# Patient Record
Sex: Male | Born: 1985 | Race: Asian | Hispanic: No | Marital: Married | Smoking: Never smoker
Health system: Southern US, Community
[De-identification: ages and names within clinical notes are randomized; demographics above are authoritative.]

## PROBLEM LIST (undated history)

## (undated) DIAGNOSIS — K759 Inflammatory liver disease, unspecified: Secondary | ICD-10-CM

---

## 2017-10-27 DIAGNOSIS — Z0000 Encounter for general adult medical examination without abnormal findings: Secondary | ICD-10-CM | POA: Diagnosis not present

## 2017-10-27 DIAGNOSIS — Z8619 Personal history of other infectious and parasitic diseases: Secondary | ICD-10-CM | POA: Diagnosis not present

## 2017-10-27 DIAGNOSIS — Z23 Encounter for immunization: Secondary | ICD-10-CM | POA: Diagnosis not present

## 2017-11-18 DIAGNOSIS — Z8619 Personal history of other infectious and parasitic diseases: Secondary | ICD-10-CM | POA: Diagnosis not present

## 2018-03-23 ENCOUNTER — Other Ambulatory Visit: Payer: Self-pay | Admitting: Gastroenterology

## 2018-03-23 DIAGNOSIS — B1910 Unspecified viral hepatitis B without hepatic coma: Secondary | ICD-10-CM

## 2018-03-23 DIAGNOSIS — B169 Acute hepatitis B without delta-agent and without hepatic coma: Secondary | ICD-10-CM | POA: Diagnosis not present

## 2018-03-30 ENCOUNTER — Ambulatory Visit
Admission: RE | Admit: 2018-03-30 | Discharge: 2018-03-30 | Disposition: A | Discharge status: Home / Self Care | Payer: BLUE CROSS/BLUE SHIELD | Source: Ambulatory Visit | Attending: Gastroenterology | Admitting: Gastroenterology

## 2018-03-30 DIAGNOSIS — B1910 Unspecified viral hepatitis B without hepatic coma: Secondary | ICD-10-CM

## 2018-03-30 DIAGNOSIS — K7689 Other specified diseases of liver: Secondary | ICD-10-CM | POA: Diagnosis not present

## 2018-04-02 ENCOUNTER — Other Ambulatory Visit (HOSPITAL_COMMUNITY): Payer: Self-pay | Admitting: Gastroenterology

## 2018-04-02 DIAGNOSIS — B1910 Unspecified viral hepatitis B without hepatic coma: Secondary | ICD-10-CM

## 2018-04-10 ENCOUNTER — Other Ambulatory Visit: Payer: Self-pay | Admitting: Student

## 2018-04-13 ENCOUNTER — Ambulatory Visit (HOSPITAL_COMMUNITY)
Admission: RE | Admit: 2018-04-13 | Discharge: 2018-04-13 | Disposition: A | Discharge status: Home / Self Care | Payer: BLUE CROSS/BLUE SHIELD | Source: Ambulatory Visit | Attending: Gastroenterology | Admitting: Gastroenterology

## 2018-04-13 ENCOUNTER — Encounter (HOSPITAL_COMMUNITY): Payer: Self-pay

## 2018-04-13 DIAGNOSIS — B1910 Unspecified viral hepatitis B without hepatic coma: Secondary | ICD-10-CM | POA: Diagnosis not present

## 2018-04-13 DIAGNOSIS — K739 Chronic hepatitis, unspecified: Secondary | ICD-10-CM | POA: Diagnosis not present

## 2018-04-13 HISTORY — DX: Inflammatory liver disease, unspecified: K75.9

## 2018-04-13 LAB — CBC
HEMATOCRIT: 51.2 % (ref 39.0–52.0)
HEMOGLOBIN: 15.8 g/dL (ref 13.0–17.0)
MCH: 25.5 pg — ABNORMAL LOW (ref 26.0–34.0)
MCHC: 30.9 g/dL (ref 30.0–36.0)
MCV: 82.7 fL (ref 80.0–100.0)
Platelets: 271 10*3/uL (ref 150–400)
RBC: 6.19 MIL/uL — ABNORMAL HIGH (ref 4.22–5.81)
RDW: 12.1 % (ref 11.5–15.5)
WBC: 7.1 10*3/uL (ref 4.0–10.5)
nRBC: 0 % (ref 0.0–0.2)

## 2018-04-13 LAB — PROTIME-INR
INR: 1.08
PROTHROMBIN TIME: 13.9 s (ref 11.4–15.2)

## 2018-04-13 LAB — APTT: aPTT: 33 seconds (ref 24–36)

## 2018-04-13 MED ORDER — SODIUM CHLORIDE 0.9 % IV SOLN: INTRAVENOUS | Status: DC

## 2018-04-13 MED ORDER — GELATIN ABSORBABLE 12-7 MM EX MISC
CUTANEOUS | Status: AC
  Filled 2018-04-13: qty 1

## 2018-04-13 MED ORDER — SODIUM CHLORIDE 0.9 % IV SOLN
INTRAVENOUS | Status: AC | PRN
  Administered 2018-04-13: 10 mL/h via INTRAVENOUS

## 2018-04-13 MED ORDER — MIDAZOLAM HCL 2 MG/2ML IJ SOLN
INTRAMUSCULAR | Status: AC
  Filled 2018-04-13: qty 2

## 2018-04-13 MED ORDER — FENTANYL CITRATE (PF) 100 MCG/2ML IJ SOLN
INTRAMUSCULAR | Status: AC | PRN
  Administered 2018-04-13: 50 ug via INTRAVENOUS

## 2018-04-13 MED ORDER — MIDAZOLAM HCL 2 MG/2ML IJ SOLN
INTRAMUSCULAR | Status: AC | PRN
  Administered 2018-04-13: 1 mg via INTRAVENOUS

## 2018-04-13 MED ORDER — OXYCODONE HCL 5 MG PO TABS: 5.0000 mg | ORAL_TABLET | ORAL | Status: DC | PRN

## 2018-04-13 MED ORDER — FENTANYL CITRATE (PF) 100 MCG/2ML IJ SOLN
INTRAMUSCULAR | Status: AC
  Filled 2018-04-13: qty 2

## 2018-04-13 MED ORDER — LIDOCAINE HCL (PF) 1 % IJ SOLN
INTRAMUSCULAR | Status: AC
  Filled 2018-04-13: qty 30

## 2018-04-13 NOTE — Procedures (Signed)
US guided core biopsies of right hepatic lobe.  3 cores obtained.  No blood loss and no immediate complication.

## 2018-04-13 NOTE — Discharge Instructions (Addendum)
Liver Biopsy, Care After °These instructions give you information on caring for yourself after your procedure. Your doctor may also give you more specific instructions. Call your doctor if you have any problems or questions after your procedure. °Follow these instructions at home: °· Rest at home for 1-2 days or as told by your doctor. °· Have someone stay with you for at least 24 hours. °· Do not do these things in the first 24 hours: °? Drive. °? Use machinery. °? Take care of other people. °? Sign legal documents. °? Take a bath or shower. °· There are many different ways to close and cover a cut (incision). For example, a cut can be closed with stitches, skin glue, or adhesive strips. Follow your doctor's instructions on: °? Taking care of your cut. °? Changing and removing your bandage (dressing). °? Removing whatever was used to close your cut. °· Do not drink alcohol in the first week. °· Do not lift more than 5 pounds or play contact sports for the first 2 weeks. °· Take medicines only as told by your doctor. For 1 week, do not take medicine that has aspirin in it or medicines like ibuprofen. °· Get your test results. °Contact a doctor if: °· A cut bleeds and leaves more than just a small spot of blood. °· A cut is red, puffs up (swells), or hurts more than before. °· Fluid or something else comes from a cut. °· A cut smells bad. °· You have a fever or chills. °Get help right away if: °· You have swelling, bloating, or pain in your belly (abdomen). °· You get dizzy or faint. °· You have a rash. °· You feel sick to your stomach (nauseous) or throw up (vomit). °· You have trouble breathing, feel short of breath, or feel faint. °· Your chest hurts. °· You have problems talking or seeing. °· You have trouble balancing or moving your arms or legs. °This information is not intended to replace advice given to you by your health care provider. Make sure you discuss any questions you have with your health care  provider. °Document Released: 03/19/2008 Document Revised: 11/16/2015 Document Reviewed: 08/06/2013 °Elsevier Interactive Patient Education © 2018 [employer] Inc[employer] ° ° ° ° °Moderate Conscious Sedation, Adult, Care After °These instructions provide you with information about caring for yourself after your procedure. Your health care provider may also give you more specific instructions. Your treatment has been planned according to current medical practices, but problems sometimes occur. Call your health care provider if you have any problems or questions after your procedure. °What can I expect after the procedure? °After your procedure, it is common: °· To feel sleepy for several hours. °· To feel clumsy and have poor balance for several hours. °· To have poor judgment for several hours. °· To vomit if you eat too soon. ° °Follow these instructions at home: °For at least 24 hours after the procedure: ° °· Do not: °? Participate in activities where you could fall or become injured. °? Drive. °? Use heavy machinery. °? Drink alcohol. °? Take sleeping pills or medicines that cause drowsiness. °? Make important decisions or sign legal documents. °? Take care of children on your own. °· Rest. °Eating and drinking °· Follow the diet recommended by your health care provider. °· If you vomit: °? Drink water, juice, or soup when you can drink without vomiting. °? Make sure you have little or no nausea before eating solid foods. °General instructions °·   Have a responsible adult stay with you until you are awake and alert. °· Take over-the-counter and prescription medicines only as told by your health care provider. °· If you smoke, do not smoke without supervision. °· Keep all follow-up visits as told by your health care provider. This is important. °Contact a health care provider if: °· You keep feeling nauseous or you keep vomiting. °· You feel light-headed. °· You develop a rash. °· You have a fever. °Get help right away  if: °· You have trouble breathing. °This information is not intended to replace advice given to you by your health care provider. Make sure you discuss any questions you have with your health care provider. °Document Released: 03/31/2013 Document Revised: 11/13/2015 Document Reviewed: 09/30/2015 °Elsevier Interactive Patient Education © 2018 Elsevier Inc[employer] ° ° °

## 2018-04-13 NOTE — H&P (Signed)
Chief Complaint: Patient was seen in consultation today for random liver biopsy at the request of Kerin Salen  Referring Physician(s): Kerin Salen  Supervising Physician: Richarda Overlie  Patient Status: [facility] - Out-pt  History of Present Illness: Gregory Browning is a 32 y.o. male   Hep B: 10 yr hx Dr Marca Ancona requesting random liver bx  [place] 03/30/18: IMPRESSION: Increased hepatic echotexture likely reflecting fatty infiltrative change. Well-circumscribed 9 mm diameter right lobe hyperechoic focus likely reflects a hemangioma. Six-month follow-up ultrasound is recommended.     Past Medical History:  Diagnosis Date  . Hepatitis     History reviewed. No pertinent surgical history.  Allergies: Patient has no known allergies.  Medications: Prior to Admission medications   Not on File     Family History  Problem Relation Age of Onset  . Liver disease Mother   . Diabetes Mother   . Liver disease Father   . Liver disease Brother     Social History   Socioeconomic History  . Marital status: Married    Spouse name: Not on file  . Number of children: Not on file  . Years of education: Not on file  . Highest education level: Not on file  Occupational History  . Not on file  Social Needs  . Financial resource strain: Not on file  . Food insecurity:    Worry: Not on file    Inability: Not on file  . Transportation needs:    Medical: Not on file    Non-medical: Not on file  Tobacco Use  . Smoking status: Never Smoker  . Smokeless tobacco: Never Used  Substance and Sexual Activity  . Alcohol use: Yes    Comment: occassional  . Drug use: Never  . Sexual activity: Not on file  Lifestyle  . Physical activity:    Days per week: Not on file    Minutes per session: Not on file  . Stress: Not on file  Relationships  . Social connections:    Talks on phone: Not on file    Gets together: Not on file    Attends religious service: Not on file    Active member of club or  organization: Not on file    Attends meetings of clubs or organizations: Not on file    Relationship status: Not on file  Other Topics Concern  . Not on file  Social History Narrative  . Not on file    Review of Systems: A 12 point ROS discussed and pertinent positives are indicated in the HPI above.  All other systems are negative.  Review of Systems  Constitutional: Negative for activity change, fatigue and fever.  Respiratory: Negative for cough and shortness of breath.   Cardiovascular: Negative for chest pain.  Gastrointestinal: Negative for abdominal pain.  Musculoskeletal: Negative for back pain.  Neurological: Negative for weakness.  Psychiatric/Behavioral: Negative for behavioral problems and confusion.    Vital Signs: BP 104/78   Pulse 81   Temp 97.9 F (36.6 C)   Resp 16   Ht 5\' 5"  (1.651 m)   Wt 110 lb (49.9 kg)   SpO2 100%   BMI 18.30 kg/m   Physical Exam  Constitutional: He is oriented to person, place, and time. He appears well-nourished.  HENT:  Head: Atraumatic.  Eyes: Conjunctivae and EOM are normal.  Cardiovascular: Normal rate, regular rhythm and normal heart sounds.  Pulmonary/Chest: Effort normal and breath sounds normal.  Abdominal: Soft. Bowel sounds are normal.  Musculoskeletal:  Normal range of motion.  Neurological: He is alert and oriented to person, place, and time.  Skin: Skin is warm and dry.  Psychiatric: He has a normal mood and affect. His behavior is normal. Judgment and thought content normal.  Vitals reviewed.   Imaging: US Abdomen Limited Ruq  Result Date: 03/30/2018 CLINICAL DATA:  Ten year history of hepatitis B EXAM: ULTRASOUND ABDOMEN LIMITED RIGHT UPPER QUADRANT COMPARISON:  None. FINDINGS: Gallbladder: No gallstones or wall thickening visualized. No sonographic Murphy sign noted by sonographer. Common bile duct: Diameter: 1.7 mm Liver: The hepatic echotexture is mildly increased diffusely. In the right hepatic lobe there is  a subtle area of increased echogenicity that is smoothly marginated and measures 9 mm in diameter. There is some internal vascularity. There is no intrahepatic ductal dilation. The surface contour of the liver is smooth. Portal vein is patent on color Doppler imaging with normal direction of blood flow towards the liver. IMPRESSION: Increased hepatic echotexture likely reflecting fatty infiltrative change. Well-circumscribed 9 mm diameter right lobe hyperechoic focus likely reflects a hemangioma. Six-month follow-up ultrasound is recommended. Normal appearance of the gallbladder and common bile duct. Electronically Signed   By: David  [place] M.D.   On: 03/30/2018 10:53    Labs:  CBC: No results for input(s): WBC, HGB, HCT, PLT in the last 8760 hours.  COAGS: Recent Labs    04/13/18 0655  INR 1.08  APTT 33    BMP: No results for input(s): NA, K, CL, CO2, GLUCOSE, BUN, CALCIUM, CREATININE, GFRNONAA, GFRAA in the last 8760 hours.  Invalid input(s): CMP  LIVER FUNCTION TESTS: No results for input(s): BILITOT, AST, ALT, ALKPHOS, PROT, ALBUMIN in the last 8760 hours.  TUMOR MARKERS: No results for input(s): AFPTM, CEA, CA199, CHROMGRNA in the last 8760 hours.  Assessment and Plan:  Hep B 10 yrs US showing hemangioma and fatty infiltrative changes Now scheduled for random liver biopsy Risks and benefits discussed with the patient including, but not limited to bleeding, infection, damage to adjacent structures or low yield requiring additional tests.  All of the patient's questions were answered, patient is agreeable to proceed. Consent signed and in chart.   Thank you for this interesting consult.  I greatly enjoyed meeting Gregory Browning and look forward to participating in their care.  A copy of this report was sent to the requesting provider on this date.  Electronically Signed: Robet Leu, PA-C 04/13/2018, 7:59 AM   I spent a total of  30 Minutes   in face to face in clinical  consultation, greater than 50% of which was counseling/coordinating care for random liver bx

## 2018-04-20 DIAGNOSIS — B181 Chronic viral hepatitis B without delta-agent: Secondary | ICD-10-CM | POA: Diagnosis not present

## 2018-05-25 DIAGNOSIS — B181 Chronic viral hepatitis B without delta-agent: Secondary | ICD-10-CM | POA: Diagnosis not present

## 2018-06-22 DIAGNOSIS — B181 Chronic viral hepatitis B without delta-agent: Secondary | ICD-10-CM | POA: Diagnosis not present

## 2018-09-22 ENCOUNTER — Other Ambulatory Visit: Payer: Self-pay | Admitting: Gastroenterology

## 2018-09-22 DIAGNOSIS — B1910 Unspecified viral hepatitis B without hepatic coma: Secondary | ICD-10-CM

## 2018-09-25 ENCOUNTER — Other Ambulatory Visit: Payer: BLUE CROSS/BLUE SHIELD

## 2018-10-26 ENCOUNTER — Inpatient Hospital Stay: Admission: RE | Admit: 2018-10-26 | Payer: BLUE CROSS/BLUE SHIELD | Source: Ambulatory Visit

## 2019-02-03 IMAGING — US US BIOPSY CORE LIVER
1 series · 10 of 10 positions shown · non-contrast
Comparison: none

INDICATION: 32-year-old with hepatitis-B.  Request for random liver biopsy.

[Series 1: us biopsy core liver · 0.14mm/px · 10 of 10 slices shown]
[im 1/10]
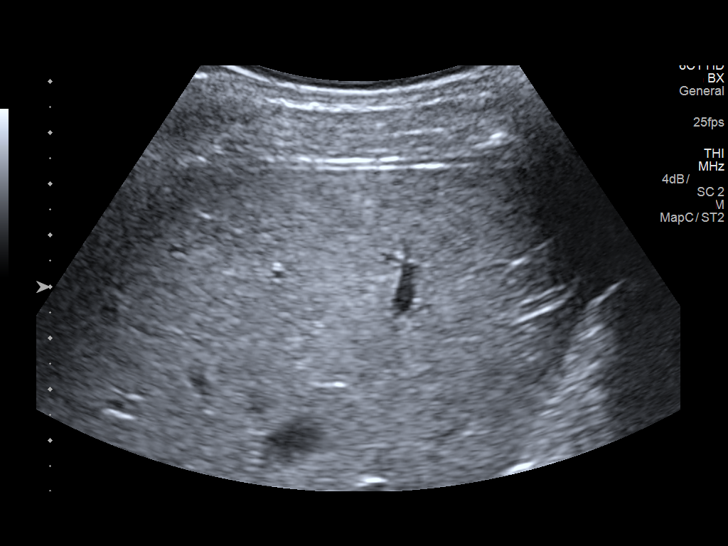
[im 2/10]
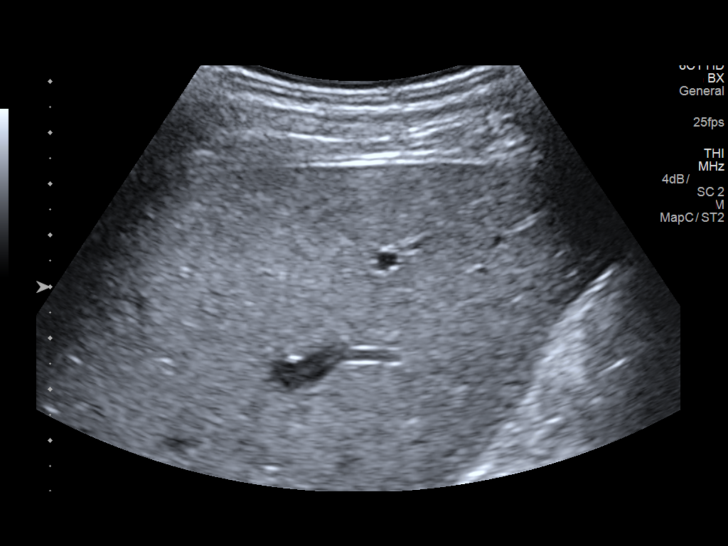
[im 3/10]
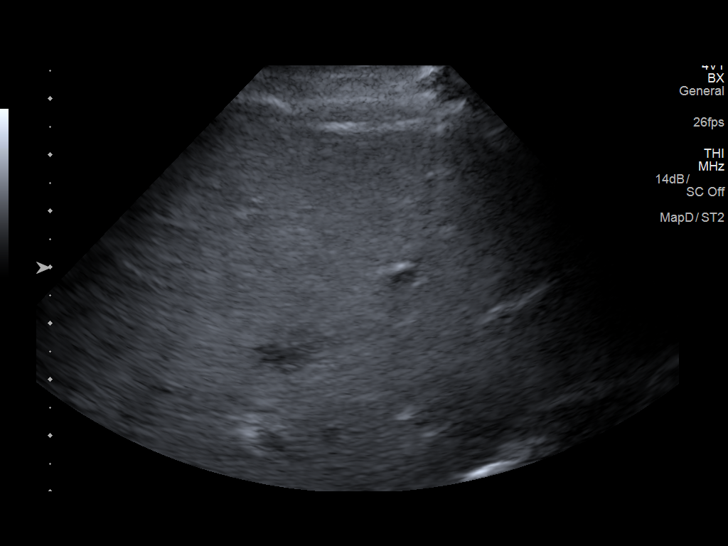
[im 4/10]
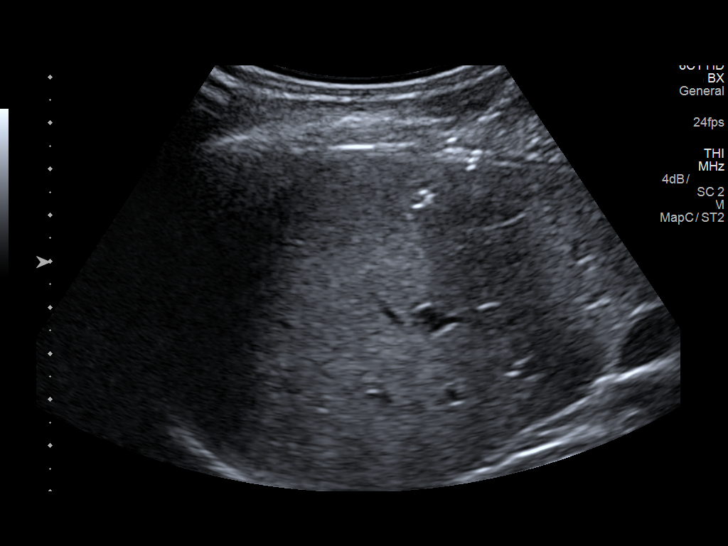
[im 5/10]
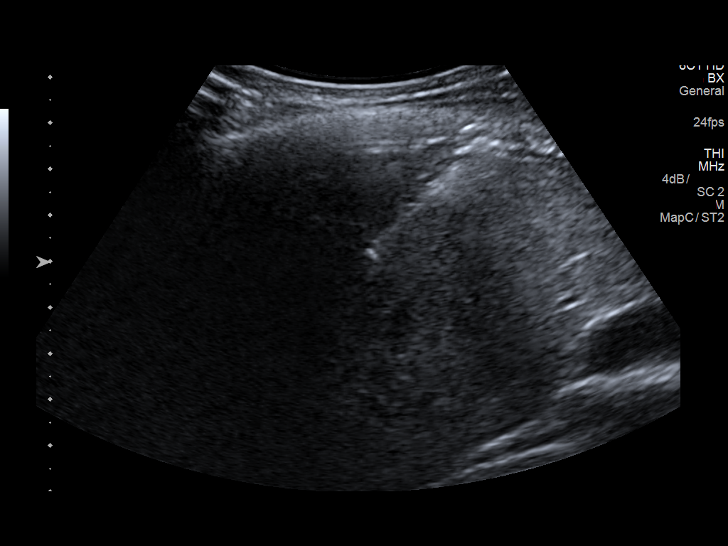
[im 6/10]
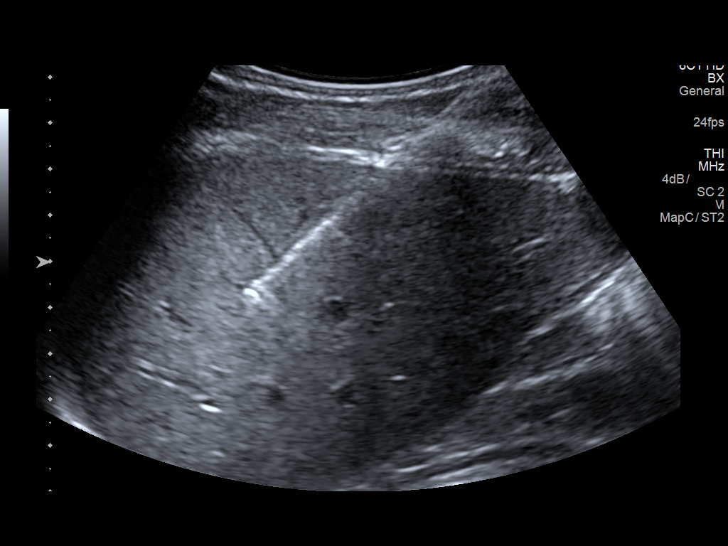
[im 7/10]
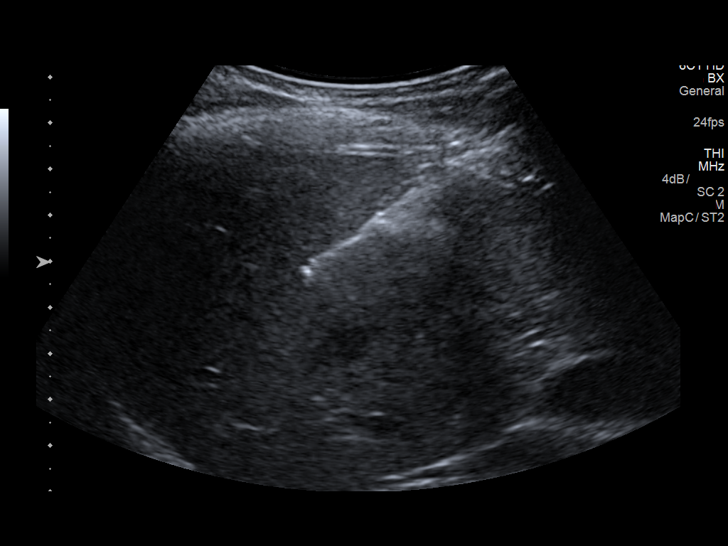
[im 8/10]
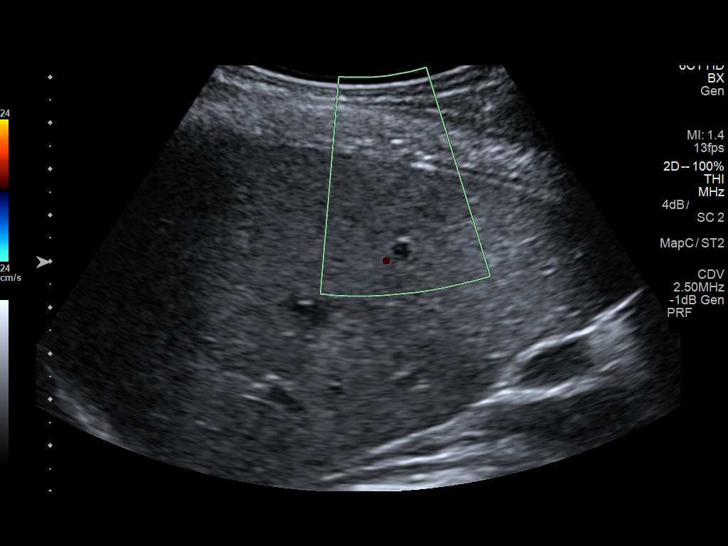
[im 9/10]
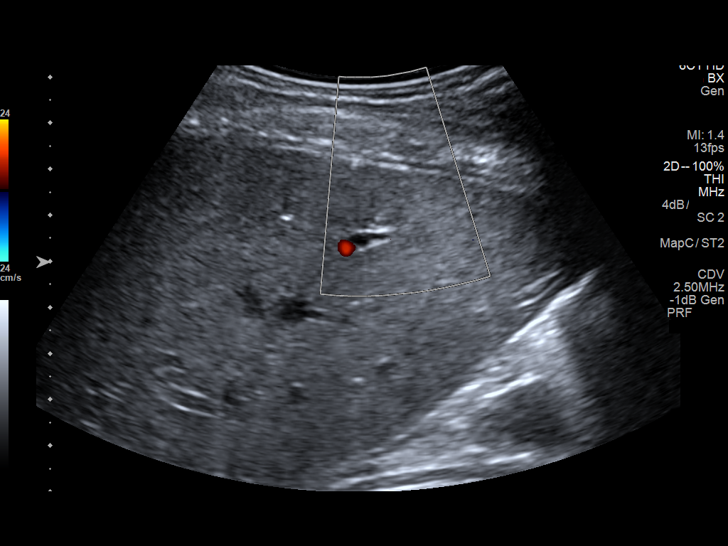
[im 10/10]
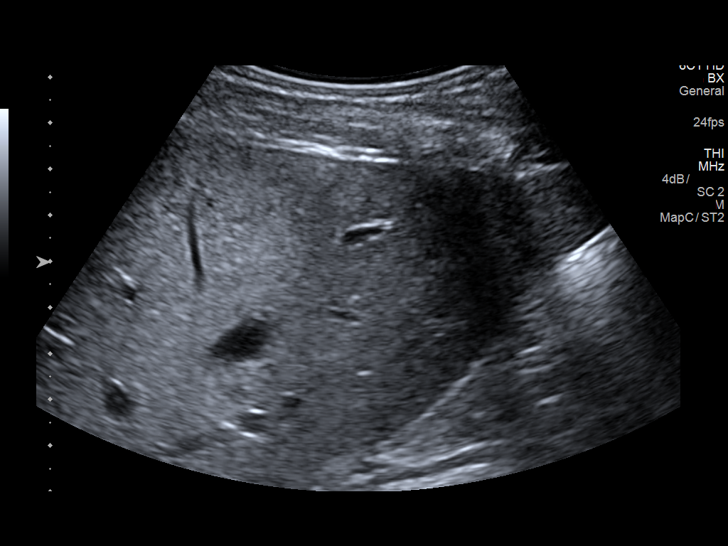

[10 of 10 positions shown; findings below may reference images not displayed]

EXAM:
ULTRASOUND-GUIDED RANDOM LIVER BIOPSY

MEDICATIONS:
None.

ANESTHESIA/SEDATION:
Moderate (conscious) sedation was employed during this procedure. A
total of Versed 1.0 mg and Fentanyl 50 mcg was administered
intravenously.

Moderate Sedation Time: 16 minutes. The patient's level of
consciousness and vital signs were monitored continuously by
radiology nursing throughout the procedure under my direct
supervision.

FLUOROSCOPY TIME:  None

COMPLICATIONS:
None immediate.

PROCEDURE:
Informed written consent was obtained from the patient after a
thorough discussion of the procedural risks, benefits and
alternatives. All questions were addressed and a translator was used
for this informed consent. Maximal Sterile Barrier Technique was
utilized including caps, mask, sterile gowns, sterile gloves,
sterile drape, hand hygiene and skin antiseptic. A timeout was
performed prior to the initiation of the procedure.

Liver was evaluated with ultrasound. The right hepatic lobe was
selected for biopsy. The right side of the abdomen was prepped with
chlorhexidine and sterile field was created. Skin and soft tissues
anesthetized with 1% lidocaine. 17 gauge coaxial needle directed
into the right hepatic lobe with ultrasound guidance. Total of 3
core biopsies obtained with an 18 gauge device. Adequate specimens
were obtained and placed in formalin. Needle removed without
complication. Bandage placed over the puncture site..
FINDINGS: Biopsies obtained from the right hepatic lobe. No bleeding or
hematoma formation at the end of the procedure.
IMPRESSION: Ultrasound-guided core biopsies from the right hepatic lobe.

## 2019-04-26 DIAGNOSIS — B181 Chronic viral hepatitis B without delta-agent: Secondary | ICD-10-CM | POA: Diagnosis not present

## 2019-05-24 DIAGNOSIS — B181 Chronic viral hepatitis B without delta-agent: Secondary | ICD-10-CM | POA: Diagnosis not present

## 2019-11-17 ENCOUNTER — Other Ambulatory Visit: Payer: Self-pay | Admitting: Gastroenterology

## 2019-11-17 DIAGNOSIS — B1910 Unspecified viral hepatitis B without hepatic coma: Secondary | ICD-10-CM

## 2020-01-19 ENCOUNTER — Other Ambulatory Visit: Payer: Self-pay | Admitting: Gastroenterology

## 2020-01-19 DIAGNOSIS — B181 Chronic viral hepatitis B without delta-agent: Secondary | ICD-10-CM

## 2020-02-07 ENCOUNTER — Ambulatory Visit
Admission: RE | Admit: 2020-02-07 | Discharge: 2020-02-07 | Disposition: A | Discharge status: Home / Self Care | Payer: BC Managed Care – PPO | Source: Ambulatory Visit | Attending: Gastroenterology | Admitting: Gastroenterology

## 2020-02-07 DIAGNOSIS — B181 Chronic viral hepatitis B without delta-agent: Secondary | ICD-10-CM

## 2020-02-09 ENCOUNTER — Other Ambulatory Visit: Payer: Self-pay | Admitting: Gastroenterology

## 2020-02-09 DIAGNOSIS — B1910 Unspecified viral hepatitis B without hepatic coma: Secondary | ICD-10-CM

## 2020-03-06 ENCOUNTER — Ambulatory Visit
Admission: RE | Admit: 2020-03-06 | Discharge: 2020-03-06 | Disposition: A | Discharge status: Home / Self Care | Payer: BC Managed Care – PPO | Source: Ambulatory Visit | Attending: Gastroenterology | Admitting: Gastroenterology

## 2020-03-06 ENCOUNTER — Other Ambulatory Visit: Payer: Self-pay

## 2020-03-06 DIAGNOSIS — D1803 Hemangioma of intra-abdominal structures: Secondary | ICD-10-CM | POA: Diagnosis not present

## 2020-03-06 DIAGNOSIS — B1910 Unspecified viral hepatitis B without hepatic coma: Secondary | ICD-10-CM

## 2020-03-06 DIAGNOSIS — Z8619 Personal history of other infectious and parasitic diseases: Secondary | ICD-10-CM | POA: Diagnosis not present

## 2020-03-06 DIAGNOSIS — B181 Chronic viral hepatitis B without delta-agent: Secondary | ICD-10-CM | POA: Diagnosis not present

## 2020-03-06 MED ORDER — GADOBENATE DIMEGLUMINE 529 MG/ML IV SOLN
10.0000 mL | Freq: Once | INTRAVENOUS | Status: AC | PRN
  Administered 2020-03-06: 10 mL via INTRAVENOUS

## 2020-05-30 ENCOUNTER — Ambulatory Visit
Admission: EM | Admit: 2020-05-30 | Discharge: 2020-05-30 | Disposition: A | Discharge status: Home / Self Care | Payer: BC Managed Care – PPO | Attending: Emergency Medicine | Admitting: Emergency Medicine

## 2020-05-30 ENCOUNTER — Encounter: Payer: Self-pay | Admitting: Emergency Medicine

## 2020-05-30 ENCOUNTER — Other Ambulatory Visit: Payer: Self-pay

## 2020-05-30 DIAGNOSIS — Z20822 Contact with and (suspected) exposure to covid-19: Secondary | ICD-10-CM | POA: Diagnosis not present

## 2020-05-30 DIAGNOSIS — J069 Acute upper respiratory infection, unspecified: Secondary | ICD-10-CM | POA: Diagnosis not present

## 2020-05-30 LAB — POCT RAPID STREP A (OFFICE): Rapid Strep A Screen: NEGATIVE

## 2020-05-30 MED ORDER — IBUPROFEN 800 MG PO TABS: 800.0000 mg | ORAL_TABLET | Freq: Three times a day (TID) | ORAL | 0 refills | Status: AC

## 2020-05-30 MED ORDER — CETIRIZINE HCL 10 MG PO CAPS: 10.0000 mg | ORAL_CAPSULE | Freq: Every day | ORAL | 0 refills | Status: AC

## 2020-05-30 MED ORDER — BENZONATATE 200 MG PO CAPS: 200.0000 mg | ORAL_CAPSULE | Freq: Three times a day (TID) | ORAL | 0 refills | Status: AC | PRN

## 2020-05-30 NOTE — Discharge Instructions (Signed)
Strep test negative, Covid/flu test pending Ibuprofen and Tylenol for fevers body aches headaches Tessalon for cough or may use over-the-counter Robitussin Delsym, Dimetapp Daily cetirizine to help with throat irritation and congestion Rest and fluids Follow-up if not improving or worsening

## 2020-05-30 NOTE — ED Provider Notes (Signed)
[facility]    CSN: [id number] Arrival date & time: 05/30/20  1914      History   Chief Complaint Chief Complaint  Patient presents with  . Fever  . Cough    HPI Treshaun Pezzullo is a 34 y.o. male history of hepatitis B, presenting today for evaluation of URI symptoms.  Reports fever cough sore throat and mucus for approximately 1 to 2 days.  Reports family members that have been sick, but tested negative for Covid.  He denies any GI symptoms.  He has had a lot of chills.  Denies any chest pain or shortness of breath.  HPI  Past Medical History:  Diagnosis Date  . Hepatitis     There are no problems to display for this patient.   History reviewed. No pertinent surgical history.     Home Medications    Prior to Admission medications   Medication Sig Start Date End Date Taking? Authorizing Provider  benzonatate (TESSALON) 200 MG capsule Take 1 capsule (200 mg total) by mouth 3 (three) times daily as needed for up to 7 days for cough. 05/30/20 06/06/20  Sherri Levenhagen C, PA-C  Cetirizine HCl 10 MG CAPS Take 1 capsule (10 mg total) by mouth daily for 10 days. 05/30/20 06/09/20  Sakeena Teall C, PA-C  ibuprofen (ADVIL) 800 MG tablet Take 1 tablet (800 mg total) by mouth 3 (three) times daily. 05/30/20   Shamiya Demeritt, Junius Creamer, PA-C    Family History Family History  Problem Relation Age of Onset  . Liver disease Mother   . Diabetes Mother   . Liver disease Father   . Liver disease Brother     Social History Social History   Tobacco Use  . Smoking status: Never Smoker  . Smokeless tobacco: Never Used  Vaping Use  . Vaping Use: Never used  Substance Use Topics  . Alcohol use: Yes    Comment: occassional  . Drug use: Never     Allergies   Patient has no known allergies.   Review of Systems Review of Systems  Constitutional: Positive for chills and fever. Negative for activity change, appetite change and fatigue.  HENT: Positive for congestion,  rhinorrhea and sore throat. Negative for ear pain, sinus pressure and trouble swallowing.   Eyes: Negative for discharge and redness.  Respiratory: Positive for cough. Negative for chest tightness and shortness of breath.   Cardiovascular: Negative for chest pain.  Gastrointestinal: Negative for abdominal pain, diarrhea, nausea and vomiting.  Musculoskeletal: Negative for myalgias.  Skin: Negative for rash.  Neurological: Positive for headaches. Negative for dizziness and light-headedness.     Physical Exam Triage Vital Signs ED Triage Vitals [05/30/20 1949]  Enc Vitals Group     BP (!) 112/51     Pulse Rate 96     Resp 18     Temp (!) 100.7 F (38.2 C)     Temp Source Oral     SpO2 98 %     Weight      Height      Head Circumference      Peak Flow      Pain Score 7     Pain Loc      Pain Edu?      Excl. in GC?    No data found.  Updated Vital Signs BP (!) 112/51 (BP Location: Left Arm)   Pulse 96   Temp (!) 100.7 F (38.2 C) (Oral)   Resp 18  SpO2 98%   Visual Acuity Right Eye Distance:   Left Eye Distance:   Bilateral Distance:    Right Eye Near:   Left Eye Near:    Bilateral Near:     Physical Exam Vitals and nursing note reviewed.  Constitutional:      Appearance: He is well-developed.     Comments: No acute distress  HENT:     Head: Normocephalic and atraumatic.     Ears:     Comments: Bilateral ears without tenderness to palpation of external auricle, tragus and mastoid, EAC's without erythema or swelling, TM's with good bony landmarks and cone of light. Non erythematous.     Nose: Nose normal.     Mouth/Throat:     Comments: Oral mucosa pink and moist, no tonsillar enlargement or exudate. Posterior pharynx patent and nonerythematous, no uvula deviation or swelling. Normal phonation.  Eyes:     Conjunctiva/sclera: Conjunctivae normal.  Cardiovascular:     Rate and Rhythm: Tachycardia present.  Pulmonary:     Effort: Pulmonary effort is  normal. No respiratory distress.     Comments: Breathing comfortably at rest, CTABL, no wheezing, rales or other adventitious sounds auscultated Abdominal:     General: There is no distension.  Musculoskeletal:        General: Normal range of motion.     Cervical back: Neck supple.  Skin:    General: Skin is warm and dry.  Neurological:     Mental Status: He is alert and oriented to person, place, and time.      UC Treatments / Results  Labs (all labs ordered are listed, but only abnormal results are displayed) Labs Reviewed  COVID-19, FLU A+B AND RSV  CULTURE, GROUP A STREP [facility])  POCT RAPID STREP A (OFFICE)    EKG   Radiology No results found.  Procedures Procedures (including critical care time)  Medications Ordered in UC Medications - No data to display  Initial Impression / Assessment and Plan / UC Course  I have reviewed the triage vital signs and the nursing notes.  Pertinent labs & imaging results that were available during my care of the patient were reviewed by me and considered in my medical decision making (see chart for details).     Viral URI with cough/influenza-like illness-Covid/flu test pending, fever and tachycardia here, but otherwise exam reassuring, lungs clear to auscultation, suspect viral etiology and recommending symptomatic and supportive care rest and fluids.  Discussed strict return precautions. Patient verbalized understanding and is agreeable with plan.  Final Clinical Impressions(s) / UC Diagnoses   Final diagnoses:  Encounter for screening laboratory testing for COVID-19 virus  Viral URI with cough     Discharge Instructions     Strep test negative, Covid/flu test pending Ibuprofen and Tylenol for fevers body aches headaches Tessalon for cough or may use over-the-counter Robitussin Delsym, Dimetapp Daily cetirizine to help with throat irritation and congestion Rest and fluids Follow-up if not improving or  worsening    ED Prescriptions    Medication Sig Dispense Auth. Provider   ibuprofen (ADVIL) 800 MG tablet Take 1 tablet (800 mg total) by mouth 3 (three) times daily. 21 tablet Juriel Cid C, PA-C   Cetirizine HCl 10 MG CAPS Take 1 capsule (10 mg total) by mouth daily for 10 days. 10 capsule Paiten Boies C, PA-C   benzonatate (TESSALON) 200 MG capsule Take 1 capsule (200 mg total) by mouth 3 (three) times daily as needed for up to  7 days for cough. 28 capsule Meg Niemeier, [place] C, PA-C     PDMP not reviewed this encounter.   Lew Dawes, [place] 05/31/20 1446

## 2020-05-30 NOTE — ED Triage Notes (Signed)
Pt here for fever and cough x 2 days  

## 2020-06-02 LAB — COVID-19, FLU A+B AND RSV
Influenza A, NAA: NOT DETECTED
Influenza B, NAA: NOT DETECTED
RSV, NAA: NOT DETECTED
SARS-CoV-2, NAA: NOT DETECTED

## 2020-06-02 LAB — CULTURE, GROUP A STREP (THRC)

## 2021-09-18 IMAGING — MR MR ABDOMEN WO/W CM
13 of 17 series · 32 of 48 positions shown · IV contrast (multihance)
Comparison: 02/07/2020 abdominal sonogram.

CLINICAL DATA: Hepatitis B. History of liver biopsy in 1616.
Indeterminate right liver lesion on recent abdominal sonogram study.
No acute symptoms reported.

EXAM:
MRI ABDOMEN WITHOUT AND WITH CONTRAST
TECHNIQUE: Multiplanar multisequence MR imaging of the abdomen was performed
both before and after the administration of intravenous contrast.
CONTRAST:  10mL MULTIHANCE GADOBENATE DIMEGLUMINE 529 MG/ML IV SOLN

[Series 3: T2 · axial · 6.0mm · 1.09mm/px · 1 of 30 slices shown]
[im 1/30]
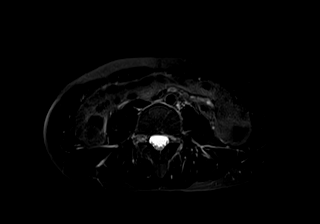

[Series 4: cor haste · coronal · 5.0mm · 0.68mm/px · 1 of 27 slices shown]
[im 1/27]
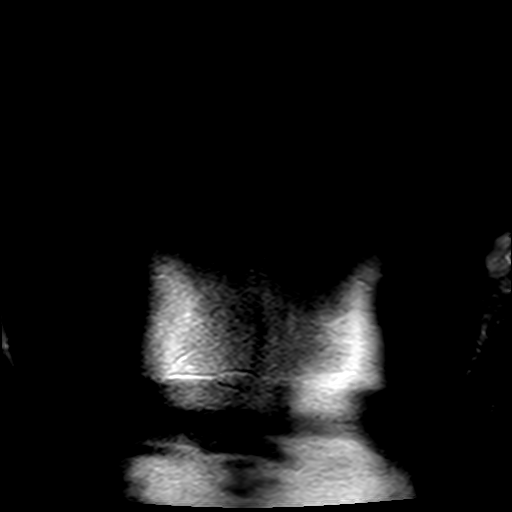

[Series 5: axial haste · axial · 6.0mm · 0.68mm/px · 1 of 33 slices shown]
[im 1/33]
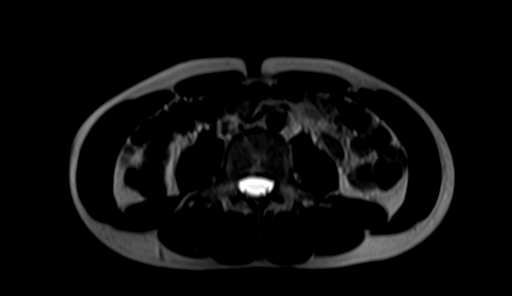

[Series 6: [id number]_trig · axial · 6.0mm · 1.82mm/px · z∈[-135,+74]mm · 3 of 90 slices shown]
[im 1/90]
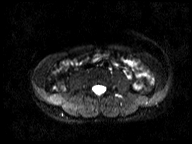
[im 45/90]
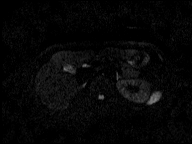
[im 90/90]
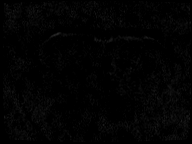

[Series 7: [id number]_trig_adc · axial · 6.0mm · 1.82mm/px · 1 of 30 slices shown]
[im 1/30]
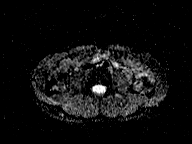

[Series 8: T1 · axial · 6.0mm · 0.68mm/px · z∈[-133,+78]mm · 2 of 66 slices shown]
[im 1/66]
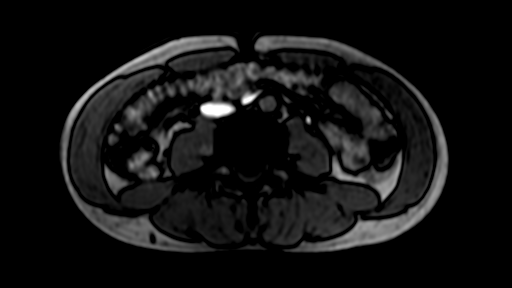
[im 66/66]
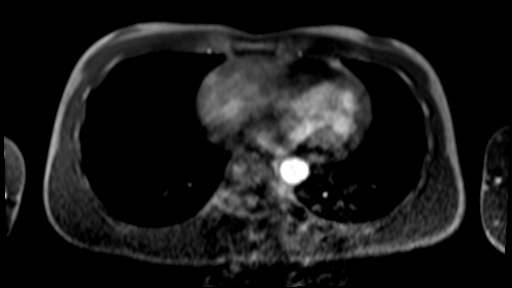

[Series 9: bSSFP · axial · 4.0mm · 0.68mm/px · 1 of 52 slices shown]
[im 1/52]
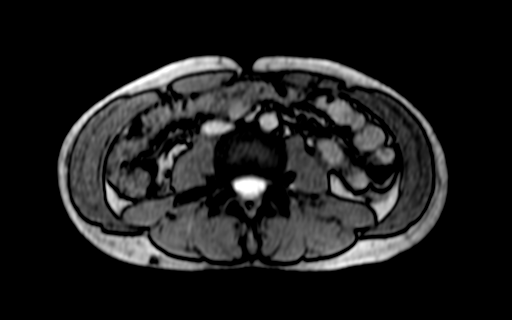

[Series 10: T1 dynamic · axial · non-contrast · 2.5mm · 0.74mm/px · z∈[-133,+85]mm · 3 of 88 slices shown]
[im 1/88]
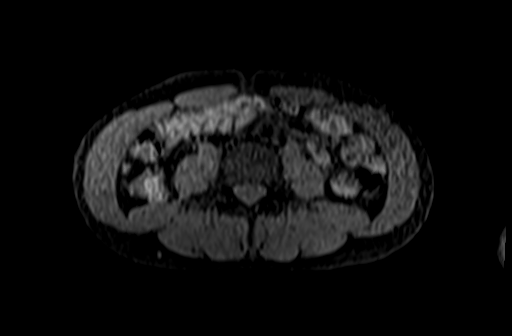
[im 44/88]
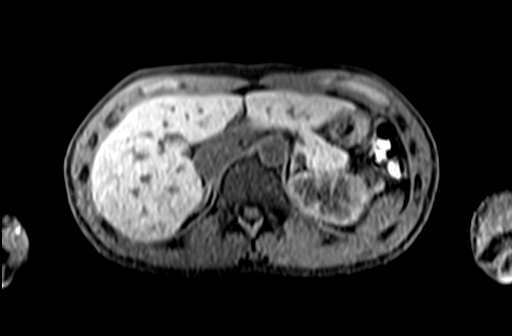
[im 88/88]
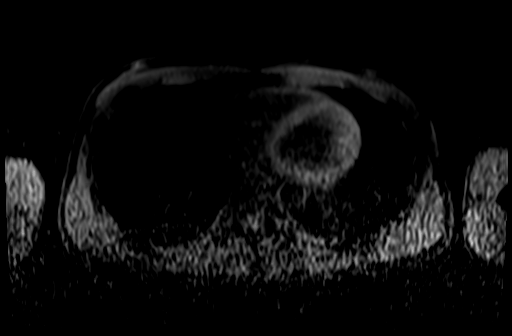

[Series 11: T1 dynamic post-contrast · axial · 2.5mm · 0.74mm/px · z∈[-133,+85]mm · 4 of 88 slices shown (1 of 5)]
[im 1/88]
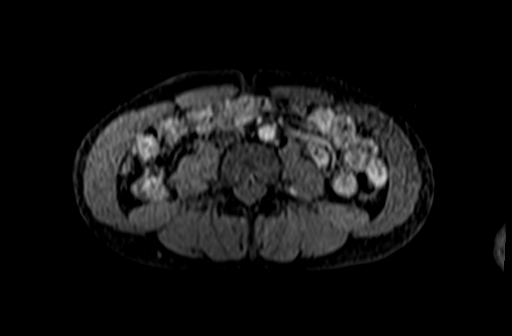
[im 30/88]
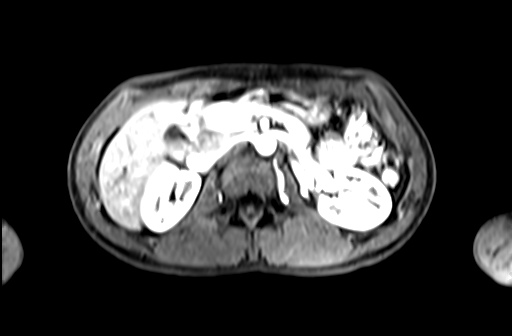
[im 59/88]
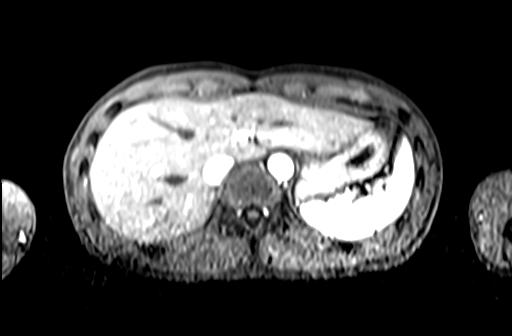
[im 88/88]
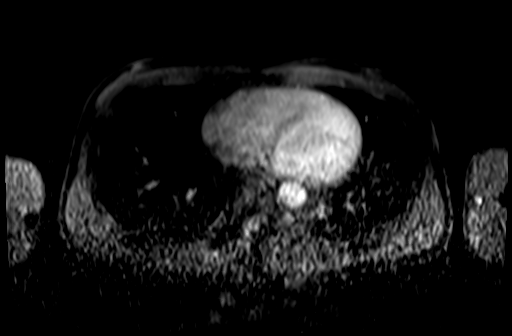

[Series 12: T1 dynamic post-contrast · axial · 2.5mm · 0.74mm/px · z∈[-133,+85]mm · 4 of 88 slices shown (2 of 5)]
[im 1/88]
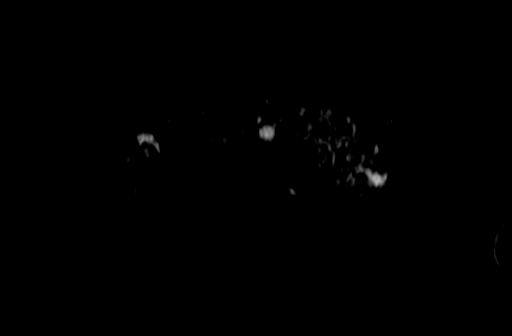
[im 30/88]
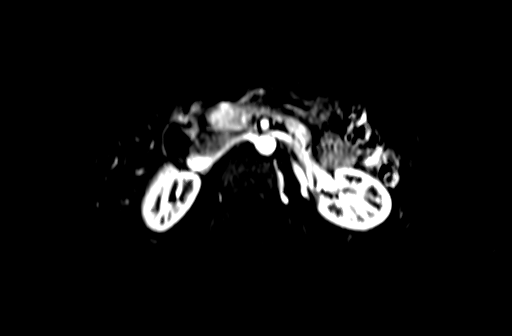
[im 59/88]
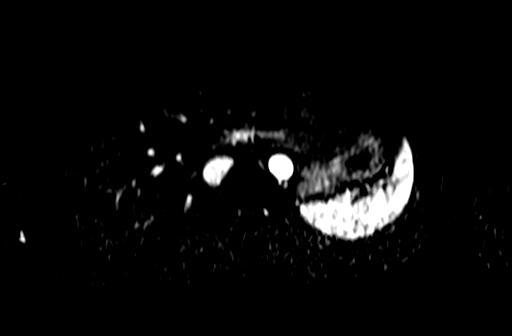
[im 88/88]
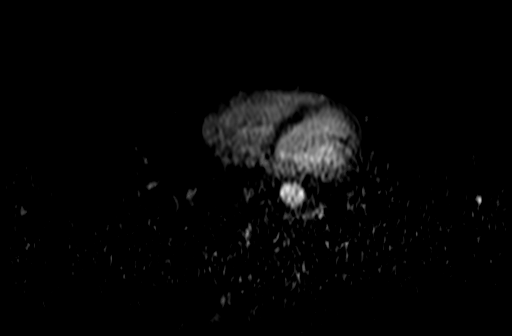

[Series 13: T1 dynamic post-contrast · axial · 2.5mm · 0.74mm/px · z∈[-133,+85]mm · 4 of 88 slices shown (3 of 5)]
[im 1/88]
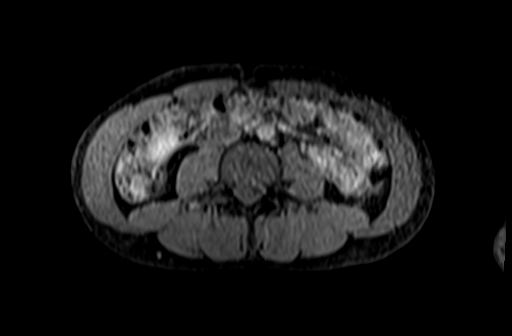
[im 30/88]
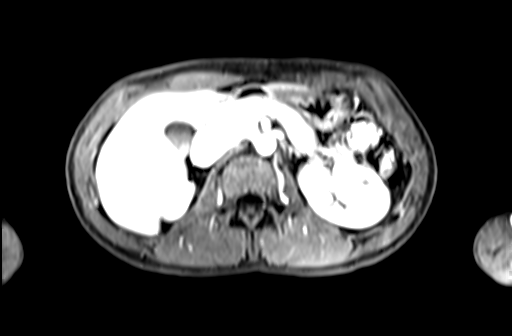
[im 59/88]
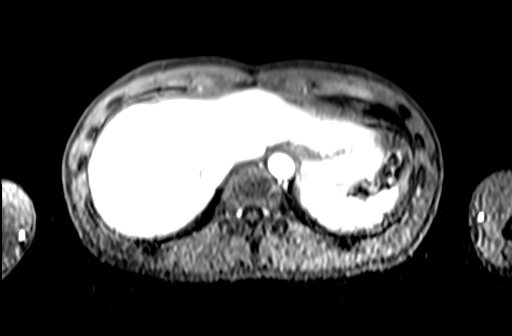
[im 88/88]
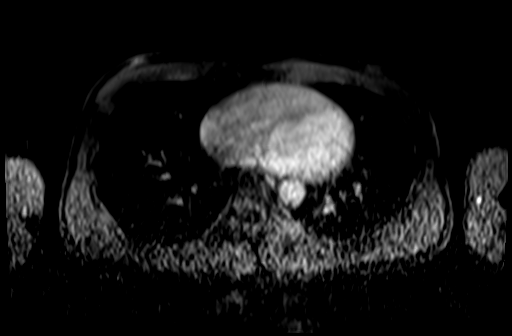

[Series 14: T1 dynamic post-contrast · axial · 2.5mm · 0.74mm/px · z∈[-133,+85]mm · 4 of 88 slices shown (4 of 5)]
[im 1/88]
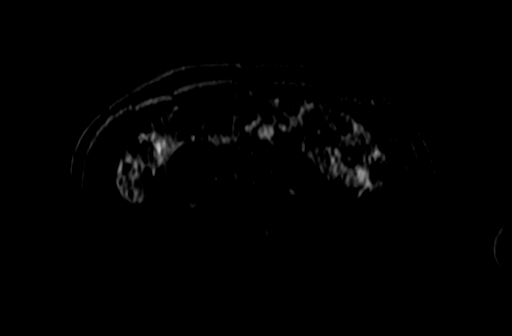
[im 30/88]
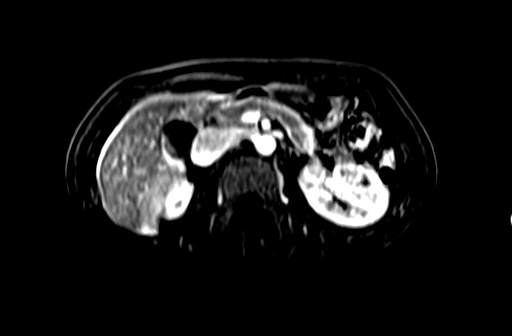
[im 59/88]
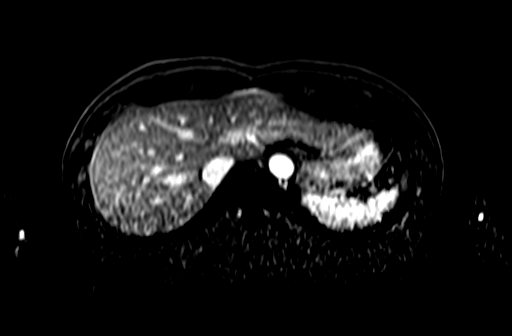
[im 88/88]
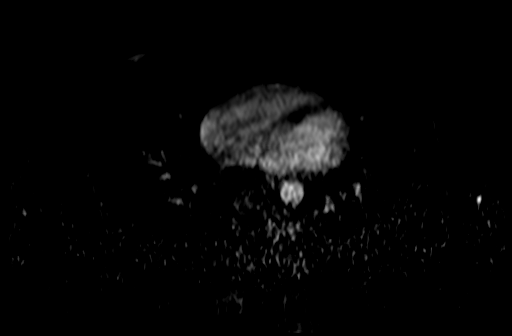

[Series 15: T1 dynamic post-contrast · axial · 2.5mm · 0.74mm/px · z∈[-133,+12]mm · 3 of 88 slices shown (5 of 5)]
[im 1/88]
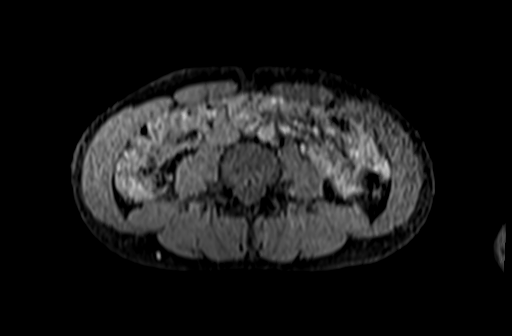
[im 30/88]
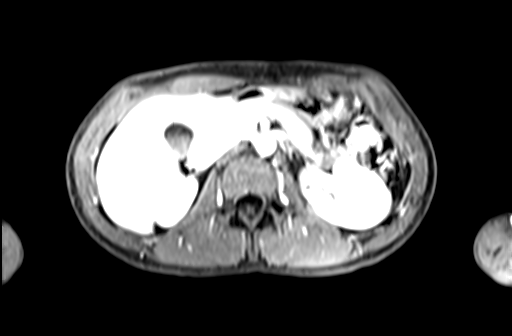
[im 59/88]
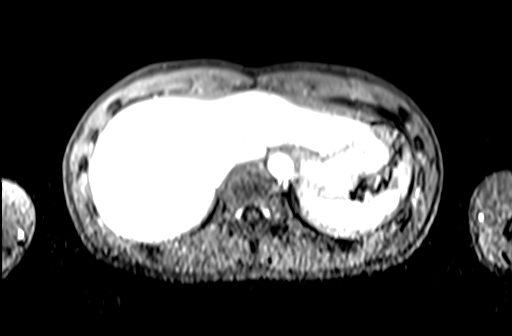

[32 of 48 positions shown; findings below may reference images not displayed]

FINDINGS: Lower chest: No acute abnormality at the lung bases.

Hepatobiliary: Normal liver size and configuration. No hepatic
steatosis. There is a flash filling 1.1 x 0.8 cm hemangioma in
segment 8 right liver lobe (series 18/image 21). No additional liver
masses. Normal gallbladder with no cholelithiasis. No biliary ductal
dilatation. Common bile duct diameter 2 mm. No choledocholithiasis.
No biliary masses, strictures or beading.

Pancreas: No pancreatic mass or duct dilation.  No pancreas divisum.

Spleen: Normal size. No mass.

Adrenals/Urinary Tract: Normal adrenals. No hydronephrosis. Normal
kidneys with no renal mass.

Stomach/Bowel: Normal non-distended stomach. Visualized small and
large bowel is normal caliber, with no bowel wall thickening.

Vascular/Lymphatic: Normal caliber abdominal aorta. Patent portal,
splenic, hepatic and renal veins. No pathologically enlarged lymph
nodes in the abdomen.

Other: No abdominal ascites or focal fluid collection.

Musculoskeletal: No aggressive appearing focal osseous lesions.
IMPRESSION: 1. Small right liver lobe hemangioma. No suspicious liver masses.
2. Otherwise normal MRI abdomen. No morphologic changes of
cirrhosis. No secondary findings of portal hypertension.

## 2022-10-07 ENCOUNTER — Other Ambulatory Visit: Payer: Self-pay | Admitting: Gastroenterology

## 2022-10-07 DIAGNOSIS — B181 Chronic viral hepatitis B without delta-agent: Secondary | ICD-10-CM | POA: Diagnosis not present

## 2022-11-25 ENCOUNTER — Other Ambulatory Visit: Payer: Self-pay | Admitting: Gastroenterology

## 2022-11-25 ENCOUNTER — Ambulatory Visit
Admission: RE | Admit: 2022-11-25 | Discharge: 2022-11-25 | Disposition: A | Discharge status: Home / Self Care | Payer: 59 | Source: Ambulatory Visit | Attending: Gastroenterology | Admitting: Gastroenterology

## 2022-11-25 DIAGNOSIS — B181 Chronic viral hepatitis B without delta-agent: Secondary | ICD-10-CM

## 2022-11-25 DIAGNOSIS — B1910 Unspecified viral hepatitis B without hepatic coma: Secondary | ICD-10-CM | POA: Diagnosis not present

## 2023-08-06 ENCOUNTER — Other Ambulatory Visit: Payer: Self-pay | Admitting: Gastroenterology

## 2023-08-06 DIAGNOSIS — B181 Chronic viral hepatitis B without delta-agent: Secondary | ICD-10-CM

## 2023-08-08 ENCOUNTER — Other Ambulatory Visit: Payer: 59

## 2023-08-14 ENCOUNTER — Other Ambulatory Visit: Payer: 59

## 2023-08-26 ENCOUNTER — Inpatient Hospital Stay: Admission: RE | Admit: 2023-08-26 | Payer: 59 | Source: Ambulatory Visit

## 2023-09-03 ENCOUNTER — Ambulatory Visit
Admission: RE | Admit: 2023-09-03 | Discharge: 2023-09-03 | Disposition: A | Discharge status: Home / Self Care | Source: Ambulatory Visit | Attending: Gastroenterology | Admitting: Gastroenterology

## 2023-09-03 DIAGNOSIS — B181 Chronic viral hepatitis B without delta-agent: Secondary | ICD-10-CM

## 2023-09-03 DIAGNOSIS — K7689 Other specified diseases of liver: Secondary | ICD-10-CM | POA: Diagnosis not present

## 2023-10-06 DIAGNOSIS — B181 Chronic viral hepatitis B without delta-agent: Secondary | ICD-10-CM | POA: Diagnosis not present

## 2024-01-05 DIAGNOSIS — B181 Chronic viral hepatitis B without delta-agent: Secondary | ICD-10-CM | POA: Diagnosis not present

## 2024-01-05 DIAGNOSIS — R197 Diarrhea, unspecified: Secondary | ICD-10-CM | POA: Diagnosis not present

## 2024-03-03 ENCOUNTER — Other Ambulatory Visit: Payer: Self-pay | Admitting: Gastroenterology

## 2024-03-03 DIAGNOSIS — B181 Chronic viral hepatitis B without delta-agent: Secondary | ICD-10-CM

## 2024-03-11 ENCOUNTER — Inpatient Hospital Stay
Admission: RE | Admit: 2024-03-11 | Discharge: 2024-03-11 | Discharge status: Home / Self Care | Source: Ambulatory Visit | Attending: Gastroenterology

## 2024-03-11 DIAGNOSIS — B181 Chronic viral hepatitis B without delta-agent: Secondary | ICD-10-CM

## 2024-04-05 DIAGNOSIS — B181 Chronic viral hepatitis B without delta-agent: Secondary | ICD-10-CM | POA: Diagnosis not present
